# Patient Record
Sex: Female | Born: 1960 | Race: White | Hispanic: No | Marital: Married | Smoking: Never smoker
Health system: Southern US, Community
[De-identification: ages and names within clinical notes are randomized; demographics above are authoritative.]

## PROBLEM LIST (undated history)

## (undated) DIAGNOSIS — K635 Polyp of colon: Secondary | ICD-10-CM

## (undated) DIAGNOSIS — S92919A Unspecified fracture of unspecified toe(s), initial encounter for closed fracture: Secondary | ICD-10-CM

## (undated) DIAGNOSIS — I519 Heart disease, unspecified: Secondary | ICD-10-CM

## (undated) DIAGNOSIS — R140 Abdominal distension (gaseous): Secondary | ICD-10-CM

## (undated) DIAGNOSIS — Z8241 Family history of sudden cardiac death: Secondary | ICD-10-CM

## (undated) HISTORY — PX: CHOLECYSTECTOMY: SHX55

## (undated) HISTORY — DX: Heart disease, unspecified: I51.9

## (undated) HISTORY — DX: Unspecified fracture of unspecified toe(s), initial encounter for closed fracture: S92.919A

## (undated) HISTORY — DX: Polyp of colon: K63.5

## (undated) HISTORY — DX: Abdominal distension (gaseous): R14.0

## (undated) HISTORY — DX: Family history of sudden cardiac death: Z82.41

## (undated) HISTORY — PX: CHOLECYSTECTOMY, LAPAROSCOPIC: SHX56

---

## 2002-09-28 ENCOUNTER — Other Ambulatory Visit: Admission: RE | Admit: 2002-09-28 | Discharge: 2002-09-28 | Payer: Self-pay | Admitting: Obstetrics and Gynecology

## 2004-03-29 ENCOUNTER — Other Ambulatory Visit: Admission: RE | Admit: 2004-03-29 | Discharge: 2004-03-29 | Payer: Self-pay | Admitting: Obstetrics and Gynecology

## 2005-01-03 ENCOUNTER — Encounter: Admission: RE | Admit: 2005-01-03 | Discharge: 2005-01-03 | Payer: Self-pay | Admitting: Internal Medicine

## 2005-07-08 HISTORY — PX: CHOLECYSTECTOMY, LAPAROSCOPIC: SHX56

## 2005-08-27 ENCOUNTER — Other Ambulatory Visit: Admission: RE | Admit: 2005-08-27 | Discharge: 2005-08-27 | Payer: Self-pay | Admitting: Obstetrics and Gynecology

## 2015-02-28 ENCOUNTER — Other Ambulatory Visit (HOSPITAL_COMMUNITY)
Admission: RE | Admit: 2015-02-28 | Discharge: 2015-02-28 | Disposition: A | Discharge status: Home / Self Care | Payer: BLUE CROSS/BLUE SHIELD | Source: Ambulatory Visit | Attending: Family Medicine | Admitting: Family Medicine

## 2015-02-28 ENCOUNTER — Other Ambulatory Visit: Payer: Self-pay | Admitting: Family Medicine

## 2015-02-28 DIAGNOSIS — Z124 Encounter for screening for malignant neoplasm of cervix: Secondary | ICD-10-CM | POA: Insufficient documentation

## 2015-03-03 LAB — CYTOLOGY - PAP

## 2017-12-28 NOTE — Progress Notes (Signed)
Cardiology Office Note   Date:  2018/01/20   ID:  AYSEL GILCHREST, DOB Apr 22, 1961, MRN [id number]  PCP:  Patient, No Pcp Per  Cardiologist:   Chilton Si, MD   No chief complaint on file.    History of Present Illness: JALANI CULLIFER is a 57 y.o. female with family history for heart disease who is being seen today for the evaluation of cardiovascular risk at the request of Juluis Rainier, MD.  Ms. Elderkin saw Dr. Zachery Dauer 01/2017 and reported that both parents died of heart disease in their 51s.  She noted mild shortness of breath and snoring. She was referred to cardiology for further evaluation.  Her mother and father both died in their sleep in their 57s.  Her monther's autopsy noted moderate CAD, myocardial fibrosis, and mild LVH but no obstructive CAD.  Her father had CABG and valve replacement prior to his death.  Ms. Lewan has been feeling well and exercises regularly.  She runs 3 miles daily and has no exertional chest pain or shortness of breath.  She has edema after standing for long periods but has no orthopnea or PND.  She has occasional sharp chest pain at rest but no exertional symptoms.  She notes that her diet is good overall but she does like fried foods.     Past Medical History:  Diagnosis Date  . Broken toe Jan 20, 2018  . Family history of sudden cardiac death 2018/01/20    Past Surgical History:  Procedure Laterality Date  . CHOLECYSTECTOMY, LAPAROSCOPIC  2007     No current outpatient medications on file.   No current facility-administered medications for this visit.     Allergies:   Patient has no allergy information on record.    Social History:  The patient  reports that she has never smoked. She has never used smokeless tobacco. She reports that she drinks alcohol. She reports that she does not use drugs.   Family History:  The patient's family history includes Heart attack in her father and paternal grandmother; Hypercholesterolemia in her  mother; Hyperlipidemia in her mother; Hypertension in her mother and sister; Stroke in her maternal grandfather; Sudden death in her father and mother.    ROS:  Please see the history of present illness.   Otherwise, review of systems are positive for none.   All other systems are reviewed and negative.    PHYSICAL EXAM: VS:  BP 124/70 Comment: right  Pulse (!) 53   Ht 5' (1.524 m)   Wt 110 lb 9.6 oz (50.2 kg)   BMI 21.60 kg/m  , BMI Body mass index is 21.6 kg/m. GENERAL:  Well appearing HEENT:  Pupils equal round and reactive, fundi not visualized, oral mucosa unremarkable NECK:  No jugular venous distention, waveform within normal limits, carotid upstroke brisk and symmetric, no bruits, no thyromegaly LYMPHATICS:  No cervical adenopathy LUNGS:  Clear to auscultation bilaterally HEART:  RRR.  PMI not displaced or sustained,S1 and S2 within normal limits, no S3, no S4, no clicks, no rubs, no murmurs ABD:  Flat, positive bowel sounds normal in frequency in pitch, no bruits, no rebound, no guarding, no midline pulsatile mass, no hepatomegaly, no splenomegaly EXT:  2 plus pulses throughout, no edema, no cyanosis no clubbing SKIN:  No rashes no nodules NEURO:  Cranial nerves II through XII grossly intact, motor grossly intact throughout PSYCH:  Cognitively intact, oriented to person place and time   EKG:  EKG is ordered today.  The ekg ordered today demonstrates sinus bradycardia.  Rate 53 bpm.   Recent Labs: 12/29/2017: ALT 17; BUN 7; Creatinine, Ser 0.82; Potassium 3.8; Sodium 143   12/11/2016: WBC 4.9, hemoglobin 13.6, hematocrit 38.6, platelets 204 Sodium 142, potassium 4.6, BUN 22, creatinine 0.85 AST 28, ALT 38 Total cholesterol 213, triglycerides 57, HDL 82, LDL 120   Lipid Panel    Component Value Date/Time   CHOL 197 12/29/2017 0917   TRIG 82 12/29/2017 0917   HDL 80 12/29/2017 0917   CHOLHDL 2.5 12/29/2017 0917   LDLCALC 101 (H) 12/29/2017 0917      Wt Readings  from Last 3 Encounters:  12/29/17 110 lb 9.6 oz (50.2 kg)      ASSESSMENT AND PLAN:  # Family history of CAD/SCD: Ms. Lew DawesRitter is very active and has no exertional symptoms.  We discussed the fact that stress testing would not be helpful for her.  She does have hyperlipidemia.  We will get a coronary calcium score to assess whether she should be on lipid therapy.  There is no evidence of HOCM on exam.  However given her family history of sudden cardiac death and report of edema we will also get an echocardiogram.  # Hyperlipidemia:  ASCVD 10-year risk is 1.4%.  Based on this she does not need a statin.  We will check a coronary calcium score.  If her risk is elevated then we will consider statin therapy for it.  For now she will try to limit [place] fries and other fried/fatty foods.   Current medicines are reviewed at length with the patient today.  The patient does not have concerns regarding medicines.  The following changes have been made:  no change  Labs/ tests ordered today include:   Orders Placed This Encounter  Procedures  . CT CARDIAC SCORING  . Lipid panel  . Comprehensive metabolic panel  . EKG 12-Lead  . ECHOCARDIOGRAM COMPLETE     Disposition:   FU with Sima Lindenberger C. Duke Salviaandolph, MD, [facility] in 2 months.      Signed, Noa Constante C. Duke Salviaandolph, MD, [facility]  12/30/2017 9:06 AM    [facility]

## 2017-12-29 ENCOUNTER — Ambulatory Visit: Payer: BC Managed Care – PPO | Admitting: Cardiovascular Disease

## 2017-12-29 ENCOUNTER — Encounter: Payer: Self-pay | Admitting: Cardiovascular Disease

## 2017-12-29 VITALS — BP 124/70 | HR 53 | Ht 60.0 in | Wt 110.6 lb

## 2017-12-29 DIAGNOSIS — S92919D Unspecified fracture of unspecified toe(s), subsequent encounter for fracture with routine healing: Secondary | ICD-10-CM

## 2017-12-29 DIAGNOSIS — R600 Localized edema: Secondary | ICD-10-CM | POA: Diagnosis not present

## 2017-12-29 DIAGNOSIS — Z8249 Family history of ischemic heart disease and other diseases of the circulatory system: Secondary | ICD-10-CM | POA: Diagnosis not present

## 2017-12-29 DIAGNOSIS — Z8241 Family history of sudden cardiac death: Secondary | ICD-10-CM

## 2017-12-29 DIAGNOSIS — E785 Hyperlipidemia, unspecified: Secondary | ICD-10-CM

## 2017-12-29 LAB — COMPREHENSIVE METABOLIC PANEL
A/G RATIO: 2.1 (ref 1.2–2.2)
ALBUMIN: 4.4 g/dL (ref 3.5–5.5)
ALT: 17 IU/L (ref 0–32)
AST: 22 IU/L (ref 0–40)
Alkaline Phosphatase: 48 IU/L (ref 39–117)
BUN / CREAT RATIO: 9 (ref 9–23)
BUN: 7 mg/dL (ref 6–24)
Bilirubin Total: 0.4 mg/dL (ref 0.0–1.2)
CALCIUM: 9.3 mg/dL (ref 8.7–10.2)
CO2: 23 mmol/L (ref 20–29)
Chloride: 108 mmol/L — ABNORMAL HIGH (ref 96–106)
Creatinine, Ser: 0.82 mg/dL (ref 0.57–1.00)
GFR, EST AFRICAN AMERICAN: 93 mL/min/{1.73_m2} (ref >59)
GFR, EST NON AFRICAN AMERICAN: 81 mL/min/{1.73_m2} (ref >59)
GLOBULIN, TOTAL: 2.1 g/dL (ref 1.5–4.5)
Glucose: 84 mg/dL (ref 65–99)
POTASSIUM: 3.8 mmol/L (ref 3.5–5.2)
SODIUM: 143 mmol/L (ref 134–144)
TOTAL PROTEIN: 6.5 g/dL (ref 6.0–8.5)

## 2017-12-29 LAB — LIPID PANEL
CHOLESTEROL TOTAL: 197 mg/dL (ref 100–199)
Chol/HDL Ratio: 2.5 ratio (ref 0.0–4.4)
HDL: 80 mg/dL (ref >39)
LDL Calculated: 101 mg/dL — ABNORMAL HIGH (ref 0–99)
TRIGLYCERIDES: 82 mg/dL (ref 0–149)
VLDL CHOLESTEROL CAL: 16 mg/dL (ref 5–40)

## 2017-12-29 NOTE — Patient Instructions (Signed)
Medication Instructions: Your physician recommends that you continue on your current medications as directed.    If you need a refill on your cardiac medications before your next appointment, please call your pharmacy.   Labwork: Your physician recommends that you return for lab work in: Today (Lipid, CMP)   Procedures/Testing: Your physician has requested that you have cardiac CT calcium score. Cardiac computed tomography (CT) is a painless test that uses an x-ray machine to take clear, detailed pictures of your heart. For further information please visit [email or web address]. Please follow instruction sheet as given. [facility]  Your physician has requested that you have an echocardiogram. Echocardiography is a painless test that uses sound waves to create images of your heart. It provides your doctor with information about the size and shape of your heart and how well your heart's chambers and valves are working. This procedure takes approximately one hour. There are no restrictions for this procedure. [address]   Follow-Up: Your physician wants you to follow-up in 2 months with Dr. Duke Salviaandolph Special Instructions:    [facility] you [facility]!!

## 2017-12-30 ENCOUNTER — Encounter: Payer: Self-pay | Admitting: Cardiovascular Disease

## 2017-12-30 DIAGNOSIS — Z8241 Family history of sudden cardiac death: Secondary | ICD-10-CM

## 2017-12-30 DIAGNOSIS — S92919A Unspecified fracture of unspecified toe(s), initial encounter for closed fracture: Secondary | ICD-10-CM

## 2017-12-30 HISTORY — DX: Unspecified fracture of unspecified toe(s), initial encounter for closed fracture: S92.919A

## 2017-12-30 HISTORY — DX: Family history of sudden cardiac death: Z82.41

## 2018-01-14 ENCOUNTER — Ambulatory Visit (HOSPITAL_COMMUNITY): Payer: BC Managed Care – PPO | Attending: Cardiology

## 2018-01-14 ENCOUNTER — Ambulatory Visit (INDEPENDENT_AMBULATORY_CARE_PROVIDER_SITE_OTHER)
Admission: RE | Admit: 2018-01-14 | Discharge: 2018-01-14 | Disposition: A | Discharge status: Home / Self Care | Payer: Self-pay | Source: Ambulatory Visit | Attending: Cardiovascular Disease | Admitting: Cardiovascular Disease

## 2018-01-14 ENCOUNTER — Other Ambulatory Visit: Payer: Self-pay

## 2018-01-14 DIAGNOSIS — Z8249 Family history of ischemic heart disease and other diseases of the circulatory system: Secondary | ICD-10-CM

## 2018-01-14 DIAGNOSIS — R600 Localized edema: Secondary | ICD-10-CM | POA: Insufficient documentation

## 2018-03-05 ENCOUNTER — Ambulatory Visit: Payer: BC Managed Care – PPO | Admitting: Cardiovascular Disease

## 2018-03-05 ENCOUNTER — Encounter: Payer: Self-pay | Admitting: Cardiovascular Disease

## 2018-03-05 VITALS — BP 112/80 | HR 82 | Ht 60.0 in | Wt 110.2 lb

## 2018-03-05 DIAGNOSIS — Z8249 Family history of ischemic heart disease and other diseases of the circulatory system: Secondary | ICD-10-CM

## 2018-03-05 NOTE — Patient Instructions (Signed)
Follow up as needed

## 2018-03-05 NOTE — Progress Notes (Signed)
Cardiology Office Note   Date:  03/05/2018   ID:  Phyllis LaurelJennifer A Anne, DOB February 23, 1961, MRN [id number]  PCP:  Juluis RainierBarnes, Elizabeth, MD  Cardiologist:   Chilton Siiffany [place] Duke, MD   No chief complaint on file.    History of Present Illness: Phyllis Brewer is a 57 y.o. female with family history for heart disease here for follow up.  She was initially seen 12/2017 for  the evaluation of cardiovascular risk.  Phyllis Brewer saw Dr. Zachery DauerBarnes 01/2017 and reported that both parents died of heart disease in their 9550s.  She noted mild shortness of breath and snoring. She was referred to cardiology for further evaluation.  Her mother and father both died in their sleep in their 4560s.  Her monther's autopsy noted moderate CAD, myocardial fibrosis, and mild LVH but no obstructive CAD.  Her father had CABG and valve replacement prior to his death.   She was referred for a coronary calcium score that revealed a calcium score of 0.  At that time she was also experiencing mild lower extremity edema.  Given her family history of sudden cardiac death she was referred for an echocardiogram.  Echo 01/14/2018 revealed LVEF 55 to 60% and grade 1 diastolic dysfunction.  It was otherwise unremarkable.  Since then she has been feeling well.  She is still recovering from a broken toe so she has not been exercising as vigorously.  However she has been able to start back walking and running a little bit.  She has no exertional chest pain or shortness of breath.  Her edema has improved.  She denies orthopnea or PND.  She has not expands palpitations, lightheadedness, or dizziness.   Past Medical History:  Diagnosis Date  . Broken toe 12/30/2017  . Family history of sudden cardiac death 12/30/2017    Past Surgical History:  Procedure Laterality Date  . CHOLECYSTECTOMY, LAPAROSCOPIC  2007     No current outpatient medications on file.   No current facility-administered medications for this visit.     Allergies:   Patient has no  known allergies.    Social History:  The patient  reports that she has never smoked. She has never used smokeless tobacco. She reports that she drinks alcohol. She reports that she does not use drugs.   Family History:  The patient's family history includes Heart attack in her father and paternal grandmother; Hypercholesterolemia in her mother; Hyperlipidemia in her mother; Hypertension in her mother and sister; Stroke in her maternal grandfather; Sudden death in her father and mother.    ROS:  Please see the history of present illness.   Otherwise, review of systems are positive for none.   All other systems are reviewed and negative.    PHYSICAL EXAM: VS:  BP 112/80   Pulse 82   Ht 5' (1.524 m)   Wt 110 lb 3.2 oz (50 kg)   SpO2 98% Comment: on room air  BMI 21.52 kg/m  , BMI Body mass index is 21.52 kg/m. GENERAL:  Well appearing HEENT: Pupils equal round and reactive, fundi not visualized, oral mucosa unremarkable NECK:  No jugular venous distention, waveform within normal limits, carotid upstroke brisk and symmetric, no bruits LUNGS:  Clear to auscultation bilaterally HEART:  RRR.  PMI not displaced or sustained,S1 and S2 within normal limits, no S3, no S4, no clicks, no rubs, no murmurs ABD:  Flat, positive bowel sounds normal in frequency in pitch, no bruits, no rebound, no guarding, no midline  pulsatile mass, no hepatomegaly, no splenomegaly EXT:  2 plus pulses throughout, no edema, no cyanosis no clubbing SKIN:  No rashes no nodules NEURO:  Cranial nerves II through XII grossly intact, motor grossly intact throughout PSYCH:  Cognitively intact, oriented to person place and time   EKG:  EKG is not ordered today. The ekg ordered 12/29/17 demonstrates sinus bradycardia.  Rate 53 bpm.   Recent Labs: 12/29/2017: ALT 17; BUN 7; Creatinine, Ser 0.82; Potassium 3.8; Sodium 143   12/11/2016: WBC 4.9, hemoglobin 13.6, hematocrit 38.6, platelets 204 Sodium 142, potassium 4.6, BUN  22, creatinine 0.85 AST 28, ALT 38 Total cholesterol 213, triglycerides 57, HDL 82, LDL 120  Coronary calcium score 01/14/18:  IMPRESSION: Coronary calcium score of 0. This was 0 percentile for age and sex matched control.  Echo 01/14/18: Study Conclusions  - Left ventricle: The cavity size was normal. Wall thickness was   normal. Systolic function was normal. The estimated ejection   fraction was in the range of 55% to 60%. Wall motion was normal;   there were no regional wall motion abnormalities. Doppler   parameters are consistent with abnormal left ventricular   relaxation (grade 1 diastolic dysfunction).  Impressions:  - Normal LV systolic function; mild diastolic dysfunction.   Lipid Panel    Component Value Date/Time   CHOL 197 12/29/2017 0917   TRIG 82 12/29/2017 0917   HDL 80 12/29/2017 0917   CHOLHDL 2.5 12/29/2017 0917   LDLCALC 101 (H) 12/29/2017 0917      Wt Readings from Last 3 Encounters:  03/05/18 110 lb 3.2 oz (50 kg)  12/29/17 110 lb 9.6 oz (50.2 kg)      ASSESSMENT AND PLAN:  # Family history of CAD/SCD: Phyllis Brewer is very active and has no exertional symptoms.  She had a coronary calcium score of 0 and her echo was unremarkable.  She is at extremely low risk of any cardiovascular issues over the next 10 years.  She was encouraged to continue exercise and watching her diet.  There is no indication for aspirin or statins.  # Hyperlipidemia: ASCVD 10-year risk is 1.4%.  Coronary calcium score 0.  Based on this she does not need a statin.  Continue with diet and exercise.   Current medicines are reviewed at length with the patient today.  The patient does not have concerns regarding medicines.  The following changes have been made:  no change  Labs/ tests ordered today include:   No orders of the defined types were placed in this encounter.    Disposition:   FU with Marisha Renier C. Duke Salvia, MD, [facility] as needed.    Signed, Tamarion Haymond C.  Duke Salvia, MD, [facility]  03/05/2018 8:33 AM    [facility]

## 2019-09-04 ENCOUNTER — Ambulatory Visit: Payer: BC Managed Care – PPO | Attending: Internal Medicine

## 2019-09-04 DIAGNOSIS — Z23 Encounter for immunization: Secondary | ICD-10-CM | POA: Insufficient documentation

## 2019-09-04 NOTE — Progress Notes (Signed)
   Covid-19 Vaccination Clinic  Name:  Phyllis Brewer    MRN: [id number] DOB: 1961/05/17  09/04/2019  Phyllis Brewer was observed post Covid-19 immunization for 15 minutes without incidence. She was provided with Vaccine Information Sheet and instruction to access the V-Safe system.   Phyllis Brewer was instructed to call 911 with any severe reactions post vaccine: Marland Kitchen Difficulty breathing  . Swelling of your face and throat  . A fast heartbeat  . A bad rash all over your body  . Dizziness and weakness    Immunizations Administered    Name Date Dose VIS Date Route   Pfizer COVID-19 Vaccine 09/04/2019  2:22 PM 0.3 mL 06/18/2019 Intramuscular   Manufacturer: [employer], [employer]   Lot: [id number]   NDC: [address]-8

## 2019-09-25 ENCOUNTER — Ambulatory Visit: Payer: BC Managed Care – PPO | Attending: Internal Medicine

## 2019-09-25 DIAGNOSIS — Z23 Encounter for immunization: Secondary | ICD-10-CM

## 2019-09-25 NOTE — Progress Notes (Signed)
   Covid-19 Vaccination Clinic  Name:  Phyllis Brewer    MRN: [id number] DOB: [birth year]  09/25/2019  [name] was observed post Covid-19 immunization for 15 minutes without incident. She was provided with Vaccine Information Sheet and instruction to access the V-Safe system.   [name] was instructed to call 911 with any severe reactions post vaccine: Marland Kitchen Difficulty breathing  . Swelling of face and throat  . A fast heartbeat  . A bad rash all over body  . Dizziness and weakness   Immunizations Administered    Name Date Dose VIS Date Route   Pfizer COVID-19 Vaccine 09/25/2019  8:15 AM 0.3 mL 06/18/2019 Intramuscular   Manufacturer: [employer], [employer]   Lot: [id number]   NDC: [address]-3

## 2019-09-25 NOTE — Progress Notes (Signed)
   Covid-19 Vaccination Clinic  Name:  Armanii A Ryall    MRN: [id number] DOB: 12/08/1960  09/25/2019  Ms. Parrillo was observed post Covid-19 immunization for 15 minutes without incident. She was provided with Vaccine Information Sheet and instruction to access the V-Safe system.   Ms. Doria was instructed to call 911 with any severe reactions post vaccine: . Difficulty breathing  . Swelling of face and throat  . A fast heartbeat  . A bad rash all over body  . Dizziness and weakness   Immunizations Administered    Name Date Dose VIS Date Route   Pfizer COVID-19 Vaccine 09/25/2019  8:15 AM 0.3 mL 06/18/2019 Intramuscular   Manufacturer: Pfizer, Inc   Lot: [id number]   NDC: [address]-1     

## 2019-09-30 IMAGING — CT CT HEART SCORING
2 series · 16 of 20 positions shown, 18 images · non-contrast
Comparison: None.

CLINICAL DATA: Risk stratification

EXAM:
Coronary Calcium Score
TECHNIQUE: The patient was scanned on a Siemens Force scanner. Axial
non-contrast 3 mm slices were carried out through the heart. The
data set was analyzed on a dedicated work station and scored using
the Agatson method.

[Series 2: casc 3.0 i36f 2 bestdiast 70 % · axial · 0.31mm/px · z∈[-216,-111]mm · 8 of 45 slices shown, 10 images]
[im 5/45  vessel]
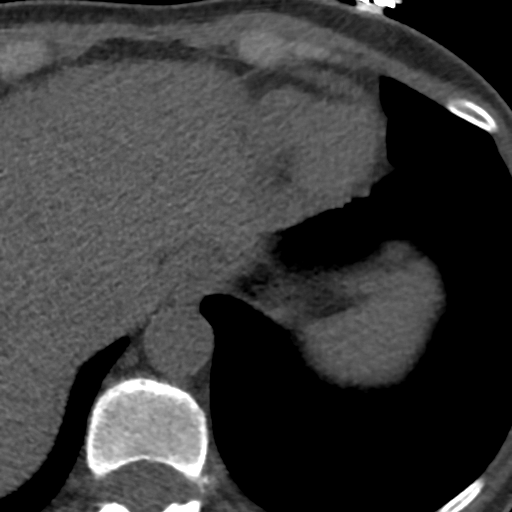
[im 5/45  lung]
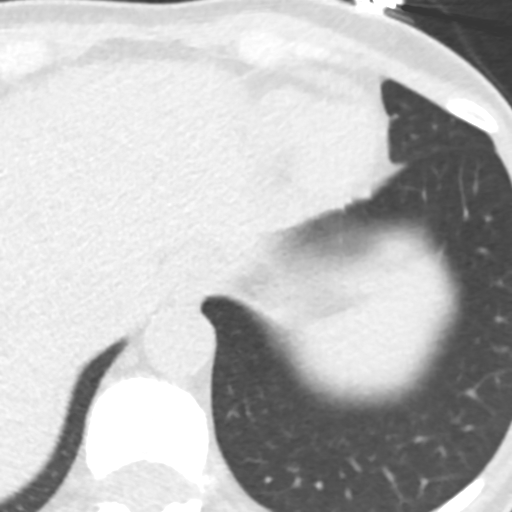
[im 10/45  vessel]
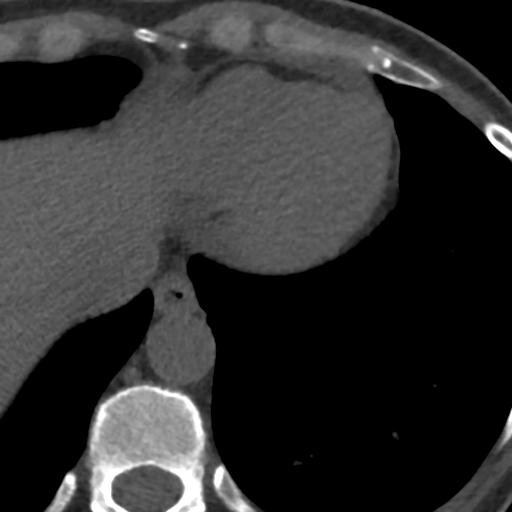
[im 15/45  vessel]
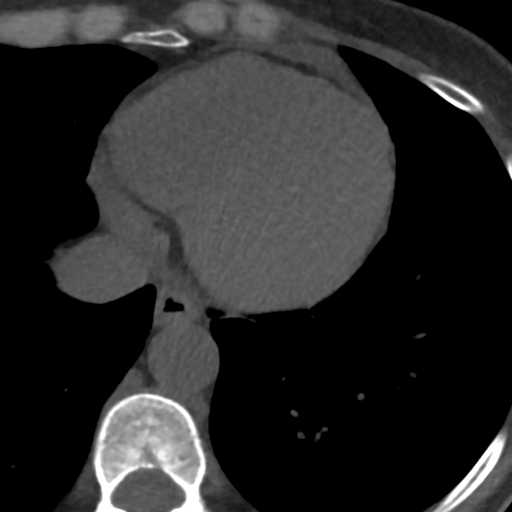
[im 20/45  vessel]
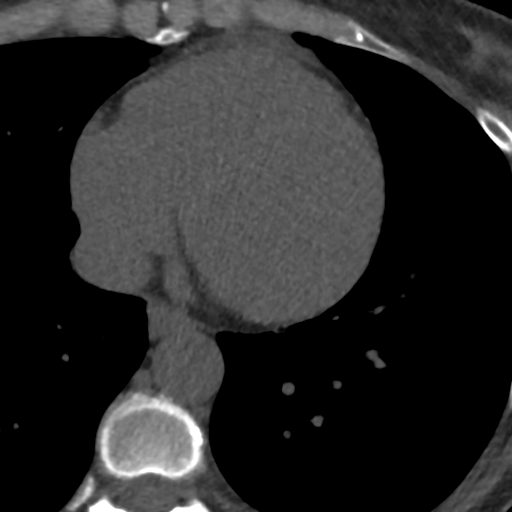
[im 25/45  vessel]
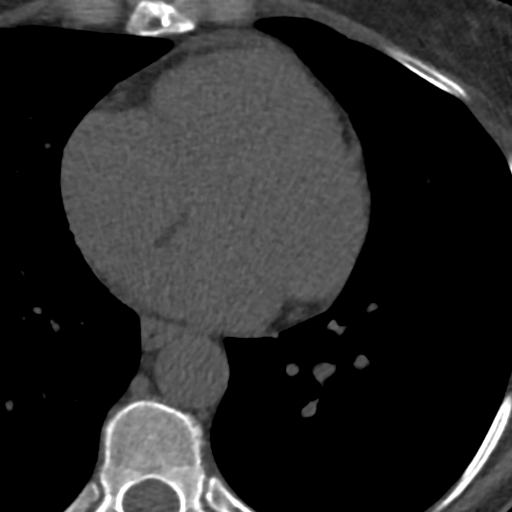
[im 25/45  lung]
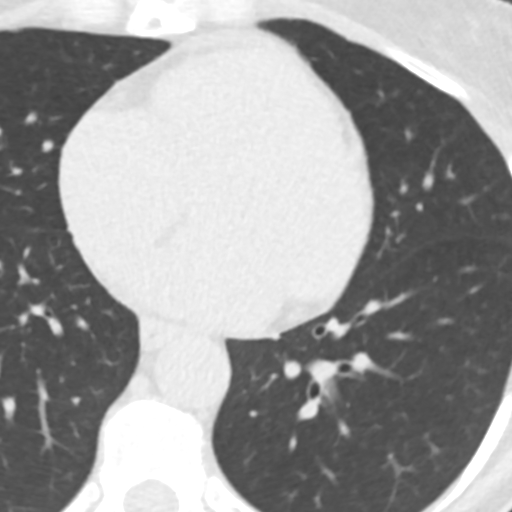
[im 30/45  vessel]
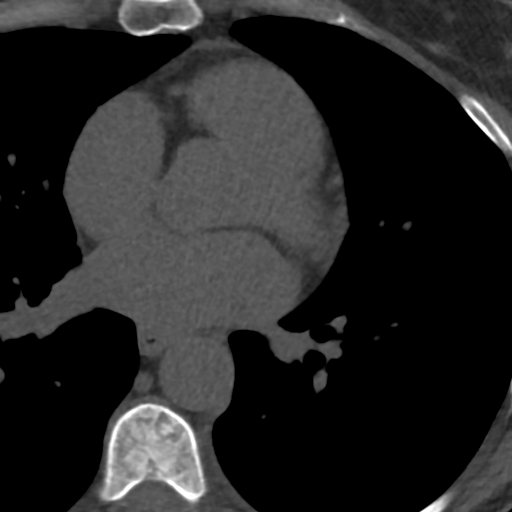
[im 35/45  vessel]
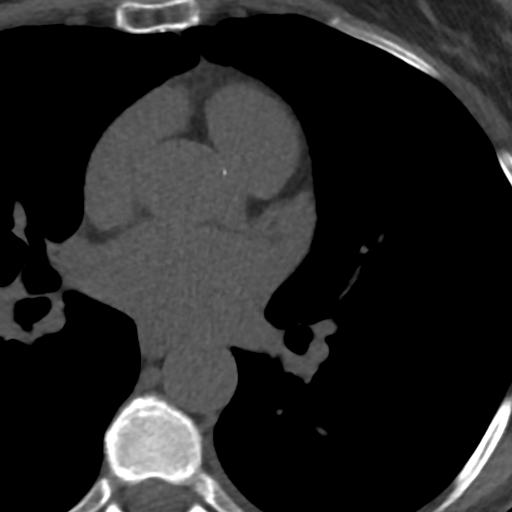
[im 40/45  vessel]
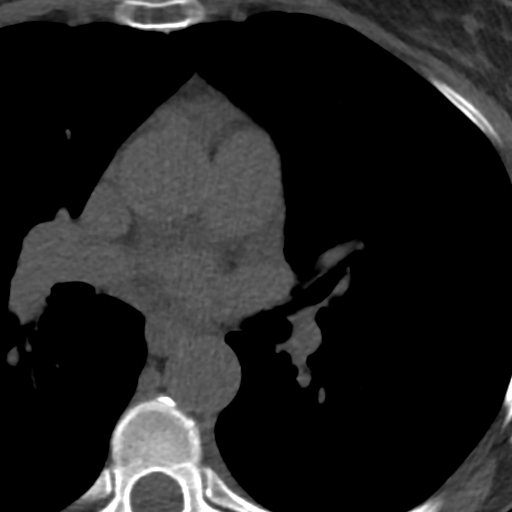

[Series 4: lung st 71 % · axial · 0.62mm/px · z∈[-216,-111]mm · 8 of 46 slices shown]
[im 6/46  lung]
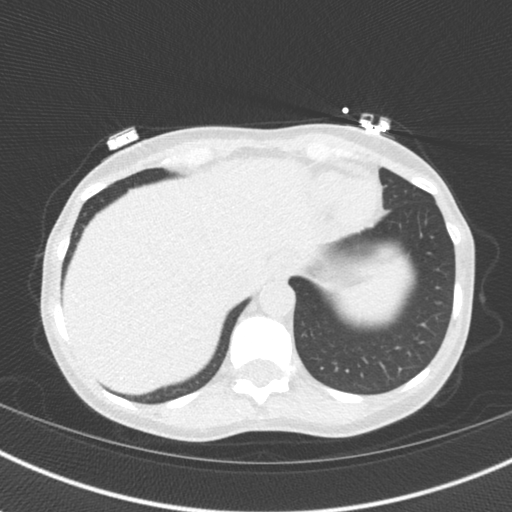
[im 11/46  lung]
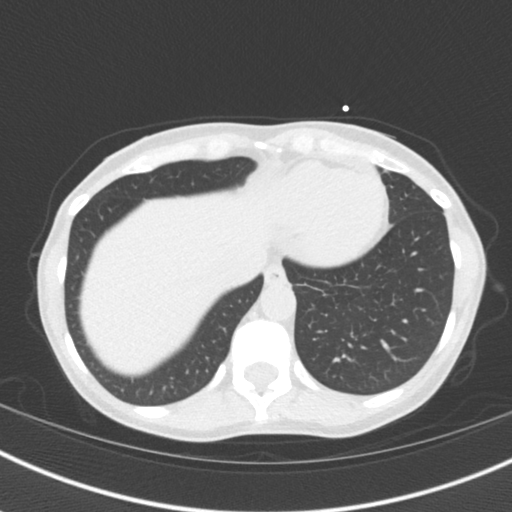
[im 16/46  lung]
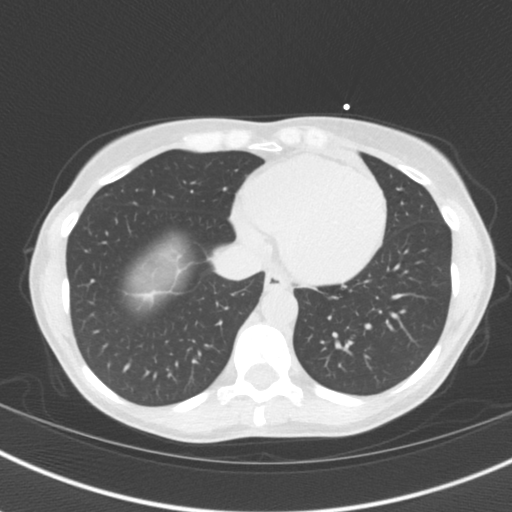
[im 21/46  lung]
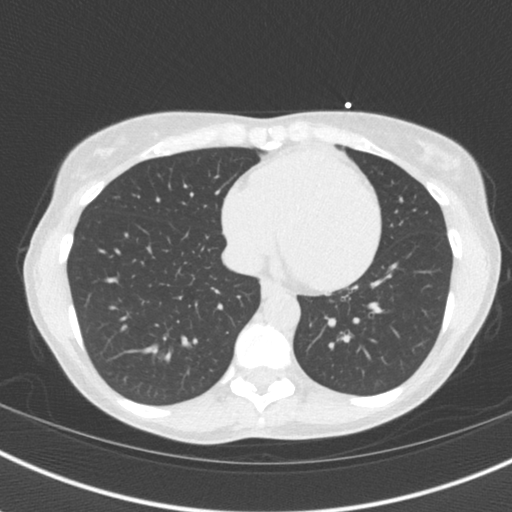
[im 26/46  lung]
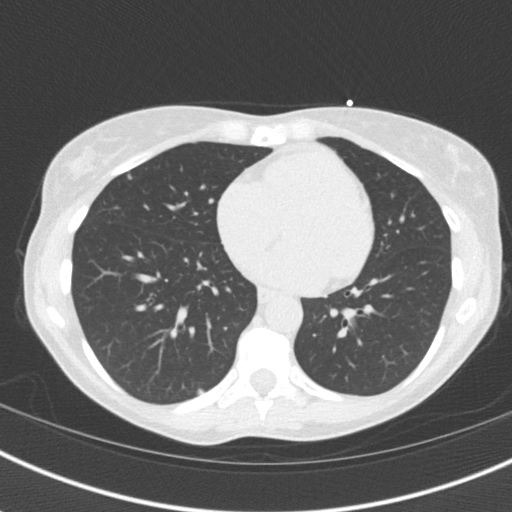
[im 31/46  lung]
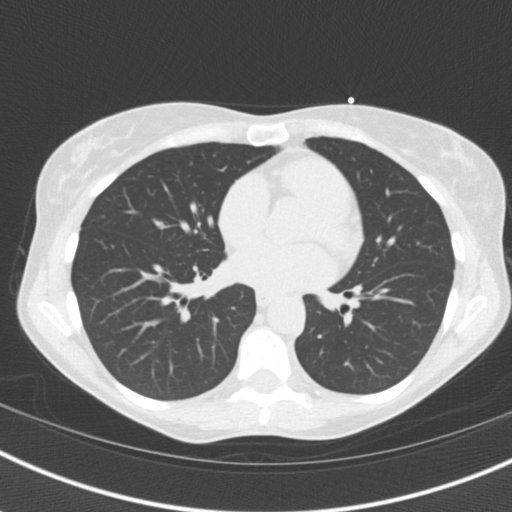
[im 36/46  lung]
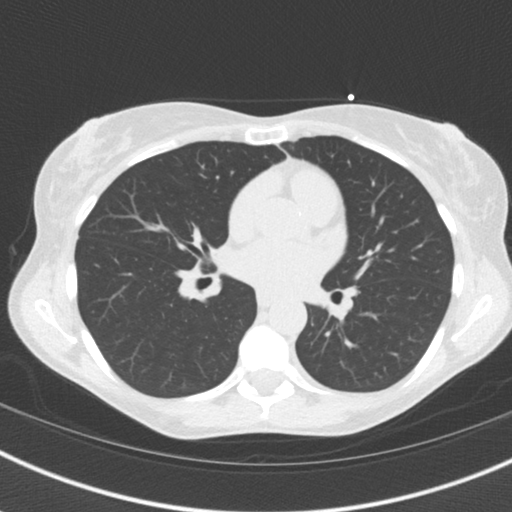
[im 41/46  lung]
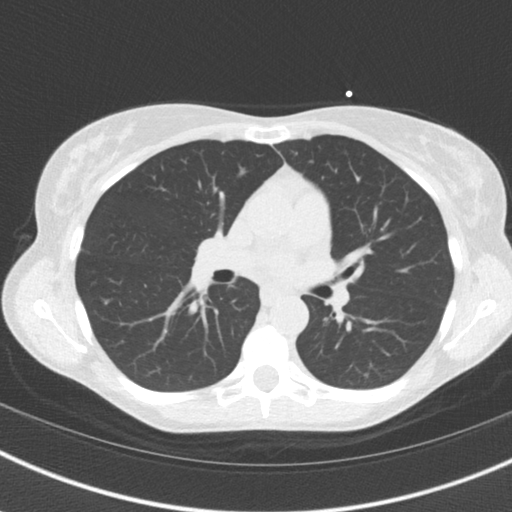

[16 of 20 positions shown; findings below may reference images not displayed]

FINDINGS: Non-cardiac: See separate report from [REDACTED].

Ascending Aorta: Normal size, trivial calcifications in the aortic
root.

Pericardium: Normal.

Coronary arteries: Normal origin.
IMPRESSION: Coronary calcium score of 0. This was 0 percentile for age and sex
matched control.

EXAM:
OVER-READ INTERPRETATION CT CHEST

The following report is an over-read performed by radiologist Dr.
over-read does not include interpretation of cardiac or coronary
anatomy or pathology. The coronary calcium score interpretation by
the cardiologist is attached.
FINDINGS: Vascular: Minimal calcified plaque involving the ascending thoracic
aorta. No evidence of aneurysm.

Mediastinum/Nodes: No pathologic lymphadenopathy within the
visualized mediastinum. Visualized esophagus normal in appearance.

Lungs/Pleura: In the POSTERIOR RIGHT LOWER LOBE there is a
peripheral 4 x 4 mm nodule (4 mm mean diameter) on image 21 of
series 3. A smaller nodule is present antral laterally in the RIGHT
MIDDLE LOBE on the same image. Visualized lung parenchyma otherwise
clear. No pleural effusions.

Upper Abdomen: Visualized upper abdomen unremarkable for the
unenhanced technique.

Musculoskeletal: Calcification in the ANTERIOR annular fibers of the
T7-8 and T8-9 discs. No acute or significant findings.
IMPRESSION: 1. 4 mm nodule involving the POSTERIOR RIGHT LOWER LOBE, most likely
a benign subpleural lymph node. No follow-up needed if patient is
low-risk. Non-contrast chest CT can be considered in 12 months if
patient is high-risk. This recommendation follows the consensus
statement: Guidelines for Management of Incidental Pulmonary Nodules
Detected on CT Images: From the [HOSPITAL] 4906; Radiology
2. Aortic Atherosclerosis, with minimal calcified plaque involving
the ascending thoracic aorta. ([id number]-170.0)

## 2023-04-03 ENCOUNTER — Other Ambulatory Visit (HOSPITAL_BASED_OUTPATIENT_CLINIC_OR_DEPARTMENT_OTHER): Payer: Self-pay | Admitting: Family Medicine

## 2023-04-03 DIAGNOSIS — Z8249 Family history of ischemic heart disease and other diseases of the circulatory system: Secondary | ICD-10-CM

## 2023-05-19 ENCOUNTER — Ambulatory Visit (HOSPITAL_COMMUNITY)
Admission: RE | Admit: 2023-05-19 | Discharge: 2023-05-19 | Disposition: A | Discharge status: Home / Self Care | Payer: Self-pay | Source: Ambulatory Visit | Attending: Family Medicine | Admitting: Family Medicine

## 2023-05-19 DIAGNOSIS — Z8249 Family history of ischemic heart disease and other diseases of the circulatory system: Secondary | ICD-10-CM | POA: Insufficient documentation

## 2023-09-02 ENCOUNTER — Encounter: Payer: Self-pay | Admitting: Internal Medicine

## 2023-11-25 ENCOUNTER — Ambulatory Visit: Payer: Self-pay | Admitting: Internal Medicine

## 2023-11-25 ENCOUNTER — Encounter: Payer: Self-pay | Admitting: Internal Medicine

## 2023-11-25 VITALS — BP 120/68 | HR 68 | Ht 60.0 in | Wt 116.0 lb

## 2023-11-25 DIAGNOSIS — K5909 Other constipation: Secondary | ICD-10-CM

## 2023-11-25 DIAGNOSIS — K5900 Constipation, unspecified: Secondary | ICD-10-CM

## 2023-11-25 DIAGNOSIS — R140 Abdominal distension (gaseous): Secondary | ICD-10-CM | POA: Diagnosis not present

## 2023-11-25 DIAGNOSIS — Z1211 Encounter for screening for malignant neoplasm of colon: Secondary | ICD-10-CM

## 2023-11-25 MED ORDER — NA SULFATE-K SULFATE-MG SULF 17.5-3.13-1.6 GM/177ML PO SOLN: 1.0000 | Freq: Once | ORAL | 0 refills | Status: AC

## 2023-11-25 NOTE — Progress Notes (Signed)
 HISTORY OF PRESENT ILLNESS:  Phyllis Brewer is a 63 y.o. 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  The patient tells me that she underwent colonoscopy in 2015 with Dr. Eleonore Grill in [facility].  She was told that her preparation was suboptimal and that she should return in 2 years for follow-up.  She did not.  Patient tells me that historically she would have a bowel movement most days.  If not, she would have a large bowel movement at some point which was quite satisfying.  Recent years she has noticed somewhat less frequent bowel movements.  She will develop bloating with time.  She has treated with milk of magnesia, which helps.  She is concerned about dependence.  No bleeding or other issues.  No family history of colon cancer.  GI review of systems is otherwise negative  REVIEW OF SYSTEMS:  All non-GI ROS negative entirely  Past Medical History:  Diagnosis Date   Bloating    Broken toe January 18, 2018   Colon polyps    Family history of sudden cardiac death 01/18/18   Heart disease     Past Surgical History:  Procedure Laterality Date   CHOLECYSTECTOMY     CHOLECYSTECTOMY, LAPAROSCOPIC  2007   CHOLECYSTECTOMY, LAPAROSCOPIC      Social History Phyllis Brewer  reports that she has never smoked. She has never used smokeless tobacco. She reports current alcohol use. She reports that she does not use drugs.  family history includes Heart attack in her father and paternal grandmother; Hypercholesterolemia in her mother; Hyperlipidemia in her mother; Hypertension in her mother and sister; Stroke in her maternal grandfather; Sudden death in her father and mother.  No Known Allergies     PHYSICAL EXAMINATION: Vital signs: BP 120/68   Pulse 68   Ht 5' (1.524 m)   Wt 116 lb  (52.6 kg)   BMI 22.65 kg/m   Constitutional: generally well-appearing, no acute distress Psychiatric: alert and oriented x3, cooperative Eyes: extraocular movements intact, anicteric, conjunctiva pink Mouth: oral pharynx moist, no lesions Neck: supple no lymphadenopathy Cardiovascular: heart regular rate and rhythm, no murmur Lungs: clear to auscultation bilaterally Abdomen: soft, nontender, nondistended, no obvious ascites, no peritoneal signs, normal bowel sounds, no organomegaly Rectal: Deferred to colonoscopy Extremities: no clubbing, cyanosis, or lower extremity edema bilaterally Skin: no lesions on visible extremities Neuro: No focal deficits.  Cranial nerves intact  ASSESSMENT:  1.  Mild constipation with bloating representing change in bowel habits 2.  Colonoscopy 10 years ago.  Said to have suboptimal prep   PLAN:  1.  Recommend MiraLAX.  Take 1 dose daily.  Titrate to need. 2.  Schedule screening colonoscopy.The nature of the procedure, as well as the risks, benefits, and alternatives were carefully and thoroughly reviewed with the patient. Ample time for discussion and questions allowed. The patient understood, was satisfied, and agreed to proceed. 3.  Further recommendations after the above

## 2023-11-25 NOTE — Patient Instructions (Signed)
 Get Miralax over the counter and take 1 dose daily - adjust as needed.  You have been scheduled for a colonoscopy. Please follow written instructions given to you at your visit today.   If you use inhalers (even only as needed), please bring them with you on the day of your procedure.  DO NOT TAKE 7 DAYS PRIOR TO TEST- Trulicity (dulaglutide) Ozempic, Wegovy (semaglutide) Mounjaro (tirzepatide) Bydureon Bcise (exanatide extended release)  DO NOT TAKE 1 DAY PRIOR TO YOUR TEST Rybelsus (semaglutide) Adlyxin (lixisenatide) Victoza (liraglutide) Byetta (exanatide) ___________________________________________________________________________  _______________________________________________________  If your blood pressure at your visit was 140/90 or greater, please contact your primary care physician to follow up on this.  _______________________________________________________  If you are age 55 or older, your body mass index should be between 23-30. Your Body mass index is 22.65 kg/m. If this is out of the aforementioned range listed, please consider follow up with your Primary Care Provider.  If you are age 55 or younger, your body mass index should be between 19-25. Your Body mass index is 22.65 kg/m. If this is out of the aformentioned range listed, please consider follow up with your Primary Care Provider.   ________________________________________________________  The [facility] providers would like to encourage you to use MYCHART to communicate with providers for non-urgent requests or questions.  Due to long hold times on the telephone, sending your provider a message by [facility] may be a faster and more efficient way to get a response.  Please allow 48 business hours for a response.  Please remember that this is for non-urgent requests.  _______________________________________________________

## 2024-01-21 ENCOUNTER — Encounter: Payer: Self-pay | Admitting: Internal Medicine

## 2024-01-26 ENCOUNTER — Telehealth: Payer: Self-pay | Admitting: Internal Medicine

## 2024-01-26 NOTE — Telephone Encounter (Signed)
 Patient called and stated that she is having a colonoscopy on July the 23 rd. Patient is stating that her colonoscopy was coded incorrectly and would like to speak to someone in our pre-cert department. Patient is requesting a call back. Please advise.

## 2024-01-26 NOTE — Telephone Encounter (Signed)
 Sonny, This should be coded as a preventative routine screening colonoscopy. Please correct if necessary. Thanks

## 2024-01-27 ENCOUNTER — Other Ambulatory Visit: Payer: Self-pay

## 2024-01-27 DIAGNOSIS — Z1211 Encounter for screening for malignant neoplasm of colon: Secondary | ICD-10-CM

## 2024-01-27 NOTE — Telephone Encounter (Signed)
 Put in a new amb. Referral stressing that the code was screening.  Joey let the patient know this was done.

## 2024-01-28 ENCOUNTER — Ambulatory Visit: Admitting: Internal Medicine

## 2024-01-28 ENCOUNTER — Encounter: Payer: Self-pay | Admitting: Internal Medicine

## 2024-01-28 VITALS — BP 120/72 | HR 76 | Temp 97.6°F | Resp 19 | Ht 60.0 in | Wt 116.0 lb

## 2024-01-28 DIAGNOSIS — D122 Benign neoplasm of ascending colon: Secondary | ICD-10-CM

## 2024-01-28 DIAGNOSIS — K648 Other hemorrhoids: Secondary | ICD-10-CM | POA: Diagnosis not present

## 2024-01-28 DIAGNOSIS — K5730 Diverticulosis of large intestine without perforation or abscess without bleeding: Secondary | ICD-10-CM | POA: Diagnosis not present

## 2024-01-28 DIAGNOSIS — K635 Polyp of colon: Secondary | ICD-10-CM

## 2024-01-28 DIAGNOSIS — K5909 Other constipation: Secondary | ICD-10-CM

## 2024-01-28 DIAGNOSIS — Z1211 Encounter for screening for malignant neoplasm of colon: Secondary | ICD-10-CM | POA: Diagnosis present

## 2024-01-28 MED ORDER — SODIUM CHLORIDE 0.9 % IV SOLN: 500.0000 mL | INTRAVENOUS | Status: DC

## 2024-01-28 NOTE — Progress Notes (Signed)
 Pt's states no medical or surgical changes since previsit or office visit.

## 2024-01-28 NOTE — Op Note (Signed)
 [facility] Patient Name: Phyllis Brewer Procedure Date: 01/28/2024 8:50 AM MRN: [id number] Endoscopist: Norleen SAILOR. Abran , MD, [id number] Age: 63 Referring MD:  Date of Birth: 1960-11-19 Gender: Female Account #: [id number] Procedure:                Colonoscopy with cold snare polypectomy x 2 Indications:              Screening for colorectal malignant neoplasm.                            Previous exam elsewhere 2015 said to be compromised                            by suboptimal prep. Medicines:                Monitored Anesthesia Care Procedure:                Pre-Anesthesia Assessment:                           - Prior to the procedure, a History and Physical                            was performed, and patient medications and                            allergies were reviewed. The patient's tolerance of                            previous anesthesia was also reviewed. The risks                            and benefits of the procedure and the sedation                            options and risks were discussed with the patient.                            All questions were answered, and informed consent                            was obtained. Prior Anticoagulants: The patient has                            taken no anticoagulant or antiplatelet agents. ASA                            Grade Assessment: II - A patient with mild systemic                            disease. After reviewing the risks and benefits,                            the patient was deemed in satisfactory condition to  undergo the procedure.                           After obtaining informed consent, the colonoscope                            was passed under direct vision. Throughout the                            procedure, the patient's blood pressure, pulse, and                            oxygen saturations were monitored continuously. The                            Olympus  Scope SN [id number] was introduced through the                            anus and advanced to the the cecum, identified by                            appendiceal orifice and ileocecal valve. The                            ileocecal valve, appendiceal orifice, and rectum                            were photographed. The quality of the bowel                            preparation was excellent. The colonoscopy was                            performed without difficulty. The patient tolerated                            the procedure well. The bowel preparation used was                            SUPREP via split dose instruction. Scope In: 9:03:05 AM Scope Out: 9:22:30 AM Scope Withdrawal Time: 0 hours 13 minutes 1 second  Total Procedure Duration: 0 hours 19 minutes 25 seconds  Findings:                 Two sessile polyps were found in the ascending                            colon. The polyps were 4 to 6 mm in size. These                            polyps were removed with a cold snare. Resection                            and retrieval were complete.  Multiple diverticula were found in the sigmoid                            colon.                           Internal hemorrhoids were found during                            retroflexion. The hemorrhoids were small.                           The exam was otherwise without abnormality on                            direct and retroflexion views. Complications:            No immediate complications. Estimated blood loss:                            None. Estimated Blood Loss:     Estimated blood loss: none. Impression:               - Two 4 to 6 mm polyps in the ascending colon,                            removed with a cold snare. Resected and retrieved.                           - Diverticulosis in the sigmoid colon.                           - Internal hemorrhoids.                           - The examination was  otherwise normal on direct                            and retroflexion views. Recommendation:           - Repeat colonoscopy in 5-10 years for surveillance.                           - Patient has a contact number available for                            emergencies. The signs and symptoms of potential                            delayed complications were discussed with the                            patient. Return to normal activities tomorrow.                            Written discharge instructions were provided to the  patient.                           - Resume previous diet.                           - Continue present medications.                           - Await pathology results. Norleen SAILOR. Abran, MD 01/28/2024 9:27:38 AM This report has been signed electronically.

## 2024-01-28 NOTE — Patient Instructions (Addendum)
Resume previous diet Continue present medications Await pathology results Handouts/ information given for polyps, diverticulosis and hemorrhoids  YOU HAD AN ENDOSCOPIC PROCEDURE TODAY AT THE [facility]:   Refer to the procedure report that was given to you for any specific questions about what was found during the examination.  If the procedure report does not answer your questions, please call your gastroenterologist to clarify.  If you requested that your care partner not be given the details of your procedure findings, then the procedure report has been included in a sealed envelope for you to review at your convenience later.  YOU SHOULD EXPECT: Some feelings of bloating in the abdomen. Passage of more gas than usual.  Walking can help get rid of the air that was put into your GI tract during the procedure and reduce the bloating. If you had a lower endoscopy (such as a colonoscopy or flexible sigmoidoscopy) you may notice spotting of blood in your stool or on the toilet paper. If you underwent a bowel prep for your procedure, you may not have a normal bowel movement for a few days.  Please Note:  You might notice some irritation and congestion in your nose or some drainage.  This is from the oxygen used during your procedure.  There is no need for concern and it should clear up in a day or so.  SYMPTOMS TO REPORT IMMEDIATELY:  Following lower endoscopy (colonoscopy or flexible sigmoidoscopy):  Excessive amounts of blood in the stool  Significant tenderness or worsening of abdominal pains  Swelling of the abdomen that is new, acute  Fever of 100F or higher  For urgent or emergent issues, a gastroenterologist can be reached at any hour by calling [phone]. Do not use MyChart messaging for urgent concerns.    DIET:  We do recommend a small meal at first, but then you may proceed to your regular diet.  Drink plenty of fluids but you should avoid alcoholic beverages for 24  hours.  ACTIVITY:  You should plan to take it easy for the rest of today and you should NOT DRIVE or use heavy machinery until tomorrow (because of the sedation medicines used during the test).    FOLLOW UP: Our staff will call the number listed on your records the next business day following your procedure.  We will call around 7:15- 8:00 am to check on you and address any questions or concerns that you may have regarding the information given to you following your procedure. If we do not reach you, we will leave a message.     If any biopsies were taken you will be contacted by phone or by letter within the next 1-3 weeks.  Please call us at [phone] if you have not heard about the biopsies in 3 weeks.    SIGNATURES/CONFIDENTIALITY: You and/or your care partner have signed paperwork which will be entered into your electronic medical record.  These signatures attest to the fact that that the information above on your After Visit Summary has been reviewed and is understood.  Full responsibility of the confidentiality of this discharge information lies with you and/or your care-partner. 

## 2024-01-28 NOTE — Progress Notes (Signed)
 Expand All Collapse All HISTORY OF PRESENT ILLNESS:   Phyllis Brewer is a 63 y.o. female, retired Editor, commissioning from [employer] and wife of Set designer and patient of mine) who presents today regarding screening colonoscopy and change in bowel habits with a tendency toward constipation and bloating.   The patient tells me that she underwent colonoscopy in 2015 with Dr. Augustus Hasten in [facility]  She was told that her preparation was suboptimal and that she should return in 2 years for follow-up.  She did not.   Patient tells me that historically she would have a bowel movement most days.  If not, she would have a large bowel movement at some point which was quite satisfying.  Recent years she has noticed somewhat less frequent bowel movements.  She will develop bloating with time.  She has treated with milk of magnesia, which helps.  She is concerned about dependence.  No bleeding or other issues.  No family history of colon cancer.  GI review of systems is otherwise negative   REVIEW OF SYSTEMS:   All non-GI ROS negative entirely       Past Medical History:  Diagnosis Date   Bloating     Broken toe 01/04/18   Colon polyps     Family history of sudden cardiac death January 04, 2018   Heart disease                 Past Surgical History:  Procedure Laterality Date   CHOLECYSTECTOMY       CHOLECYSTECTOMY, LAPAROSCOPIC   2007   CHOLECYSTECTOMY, LAPAROSCOPIC              Social History Phyllis Brewer  reports that she has never smoked. She has never used smokeless tobacco. She reports current alcohol use. She reports that she does not use drugs.   family history includes Heart attack in her father and paternal grandmother; Hypercholesterolemia in her mother; Hyperlipidemia in her mother; Hypertension in her mother and sister; Stroke in her maternal grandfather; Sudden death in her father and mother.   Allergies  No Known Allergies         PHYSICAL  EXAMINATION: Vital signs: BP 120/68   Pulse 68   Ht 5' (1.524 m)   Wt 116 lb (52.6 kg)   BMI 22.65 kg/m   Constitutional: generally well-appearing, no acute distress Psychiatric: alert and oriented x3, cooperative Eyes: extraocular movements intact, anicteric, conjunctiva pink Mouth: oral pharynx moist, no lesions Neck: supple no lymphadenopathy Cardiovascular: heart regular rate and rhythm, no murmur Lungs: clear to auscultation bilaterally Abdomen: soft, nontender, nondistended, no obvious ascites, no peritoneal signs, normal bowel sounds, no organomegaly Rectal: Deferred to colonoscopy Extremities: no clubbing, cyanosis, or lower extremity edema bilaterally Skin: no lesions on visible extremities Neuro: No focal deficits.  Cranial nerves intact   ASSESSMENT:   1.  Mild constipation with bloating representing change in bowel habits 2.  Colonoscopy 10 years ago.  Said to have suboptimal prep     PLAN:   1.  Recommend MiraLAX.  Take 1 dose daily.  Titrate to need. 2.  Schedule screening colonoscopy.The nature of the procedure, as well as the risks, benefits, and alternatives were carefully and thoroughly reviewed with the patient. Ample time for discussion and questions allowed. The patient understood, was satisfied, and agreed to proceed. 3.  Further recommendations after the above              Recent H&P  as above.  No interval change in history or physical exam.  Now for colonoscopy

## 2024-01-28 NOTE — Progress Notes (Signed)
 A/o x 3, VSS, gd SR's, pleased with anesthesia, report to RN

## 2024-01-29 ENCOUNTER — Telehealth: Payer: Self-pay | Admitting: *Deleted

## 2024-01-29 NOTE — Telephone Encounter (Signed)
  Follow up Call-     01/28/2024    7:40 AM  Call back number  Post procedure Call Back phone  # [phone]  Permission to leave phone message Yes     Patient questions:  Do you have a fever, pain , or abdominal swelling? No. Pain Score  0 *  Have you tolerated food without any problems? Yes.    Have you been able to return to your normal activities? Yes.    Do you have any questions about your discharge instructions: Diet   No. Medications  No. Follow up visit  No.  Do you have questions or concerns about your Care? No.  Actions: * If pain score is 4 or above: No action needed, pain <4.

## 2024-01-30 ENCOUNTER — Ambulatory Visit: Payer: Self-pay | Admitting: Internal Medicine

## 2024-01-30 LAB — SURGICAL PATHOLOGY

## 2024-03-18 ENCOUNTER — Encounter: Payer: Self-pay | Admitting: Family Medicine

## 2024-03-18 ENCOUNTER — Ambulatory Visit (INDEPENDENT_AMBULATORY_CARE_PROVIDER_SITE_OTHER)

## 2024-03-18 ENCOUNTER — Ambulatory Visit: Admitting: Family Medicine

## 2024-03-18 ENCOUNTER — Other Ambulatory Visit: Payer: Self-pay

## 2024-03-18 VITALS — BP 112/82 | HR 86 | Ht 60.0 in | Wt 114.0 lb

## 2024-03-18 DIAGNOSIS — M79601 Pain in right arm: Secondary | ICD-10-CM | POA: Diagnosis not present

## 2024-03-18 DIAGNOSIS — M25521 Pain in right elbow: Secondary | ICD-10-CM

## 2024-03-18 NOTE — Progress Notes (Signed)
   I, Phyllis Brewer, CMA acting as a scribe for Phyllis Lloyd, MD.  Phyllis Brewer is a 63 y.o. female who presents to [facility] today for R arm pain x 3 weeks. Pt locates pain to the lower arm, occasionally into the upper arm. Fell while playing pickle ball, landing on the right arm and hip. Trouble lifting, opening, and pouring things. Some swelling at time of injury, has resolved.   Radiates: hand and middle finger, tightness Paresthesia: denies Grip strength: decreased Aggravates: lifting, pushing, pulling Treatments tried: IBU 600 mg at bedtime, ice, heat  Pertinent review of systems: No fevers or chills  Relevant historical information: Otherwise healthy.     Exam:  BP 112/82   Pulse 86   Ht 5' (1.524 m)   Wt 114 lb (51.7 kg)   SpO2 99%   BMI 22.26 kg/m  General: Well Developed, well nourished, and in no acute distress.   MSK: Right elbow normal-appearing normal motion some pain with full extension and supination. Tender palpation lateral epicondyle area. Intact strength.  Not much pain with resisted wrist extension.  Some pain with resisted pronation supination.  Pulses capillary fill and sensation are intact distally.    Lab and Radiology Results  Diagnostic Limited MSK Ultrasound of: Right lateral elbow No significant joint effusion.  Radial head cortex is normal-appearing Lateral epicondyle area intact without visible tear. Impression: Possible lateral epicondylitis   X-ray images right elbow obtained today personally and independently interpreted. No acute fractures.  No significant joint effusion.  Minimal medial degenerative changes. Await formal radiology review  Assessment and Plan: 63 y.o. female with right lateral elbow pain occurring after a fall about 3 weeks ago.  Pain is improving but still more pronounced than I would expect at this point.  Original x-ray that I reviewed looked okay and today's x-ray to my eyes  looks okay.  Radiology overread is still pending for today's x-ray.  Plan for trial of physical therapy.  If not improved consider advanced imaging.  Patient will keep me updated.  Referred to [facility].   PDMP not reviewed this encounter. Orders Placed This Encounter  Procedures   US  LIMITED JOINT SPACE STRUCTURES UP RIGHT(NO LINKED CHARGES)    Reason for Exam (SYMPTOM  OR DIAGNOSIS REQUIRED):   right arm pain    Preferred imaging location?:   [facility]   DG ELBOW COMPLETE RIGHT (3+VIEW)    Standing Status:   Future    Number of Occurrences:   1    Expiration Date:   04/17/2024    Reason for Exam (SYMPTOM  OR DIAGNOSIS REQUIRED):   right elbow pain    Preferred imaging location?:   [facility]   Ambulatory referral to Physical Therapy    Referral Priority:   Routine    Referral Type:   Physical Medicine    Referral Reason:   Specialty Services Required    Requested Specialty:   Physical Therapy    Number of Visits Requested:   1   No orders of the defined types were placed in this encounter.    Discussed warning signs or symptoms. Please see discharge instructions. Patient expresses understanding.   The above documentation has been reviewed and is accurate and complete Phyllis Brewer, M.D.

## 2024-03-18 NOTE — Patient Instructions (Addendum)
 Thank you for coming in today.   Please get an Xray today before you leave   I've referred you to Physical Therapy.  Let us  know if you don't hear from them in one week.   Let me know if not better after completing physical therapy.

## 2024-03-26 ENCOUNTER — Ambulatory Visit: Payer: Self-pay | Admitting: Family Medicine

## 2024-03-26 NOTE — Progress Notes (Signed)
Right elbow x-ray looks normal to radiology
